# Patient Record
Sex: Male | Born: 1985 | Race: White | Hispanic: No | Marital: Single | Smoking: Never smoker
Health system: Southern US, Community
[De-identification: ages and names within clinical notes are randomized; demographics above are authoritative.]

## PROBLEM LIST (undated history)

## (undated) DIAGNOSIS — E079 Disorder of thyroid, unspecified: Secondary | ICD-10-CM

---

## 2008-07-11 ENCOUNTER — Emergency Department: Payer: Self-pay | Admitting: Emergency Medicine

## 2009-06-28 ENCOUNTER — Emergency Department: Payer: Self-pay | Admitting: Internal Medicine

## 2009-07-05 ENCOUNTER — Emergency Department: Payer: Self-pay | Admitting: Emergency Medicine

## 2016-09-08 ENCOUNTER — Emergency Department (HOSPITAL_COMMUNITY)
Admission: EM | Admit: 2016-09-08 | Discharge: 2016-09-08 | Disposition: A | Discharge status: Home / Self Care | Payer: Self-pay | Attending: Emergency Medicine | Admitting: Emergency Medicine

## 2016-09-08 ENCOUNTER — Other Ambulatory Visit: Payer: Self-pay

## 2016-09-08 ENCOUNTER — Encounter (HOSPITAL_COMMUNITY): Payer: Self-pay | Admitting: *Deleted

## 2016-09-08 ENCOUNTER — Emergency Department (HOSPITAL_COMMUNITY): Payer: Self-pay

## 2016-09-08 DIAGNOSIS — R6884 Jaw pain: Secondary | ICD-10-CM | POA: Insufficient documentation

## 2016-09-08 DIAGNOSIS — M25512 Pain in left shoulder: Secondary | ICD-10-CM | POA: Insufficient documentation

## 2016-09-08 HISTORY — DX: Disorder of thyroid, unspecified: E07.9

## 2016-09-08 LAB — BASIC METABOLIC PANEL
Anion gap: 5 (ref 5–15)
BUN: 17 mg/dL (ref 6–20)
CHLORIDE: 105 mmol/L (ref 101–111)
CO2: 30 mmol/L (ref 22–32)
Calcium: 9.6 mg/dL (ref 8.9–10.3)
Creatinine, Ser: 1.22 mg/dL (ref 0.61–1.24)
GFR calc non Af Amer: >60 mL/min (ref >60)
Glucose, Bld: 103 mg/dL — ABNORMAL HIGH (ref 65–99)
POTASSIUM: 3.6 mmol/L (ref 3.5–5.1)
SODIUM: 140 mmol/L (ref 135–145)

## 2016-09-08 LAB — CBC
HEMATOCRIT: 44.6 % (ref 39.0–52.0)
HEMOGLOBIN: 15.7 g/dL (ref 13.0–17.0)
MCH: 30.5 pg (ref 26.0–34.0)
MCHC: 35.2 g/dL (ref 30.0–36.0)
MCV: 86.6 fL (ref 78.0–100.0)
Platelets: 280 10*3/uL (ref 150–400)
RBC: 5.15 MIL/uL (ref 4.22–5.81)
RDW: 12.3 % (ref 11.5–15.5)
WBC: 9.2 10*3/uL (ref 4.0–10.5)

## 2016-09-08 LAB — I-STAT TROPONIN, ED: Troponin i, poc: 0 ng/mL (ref 0.00–0.08)

## 2016-09-08 MED ORDER — KETOROLAC TROMETHAMINE 30 MG/ML IJ SOLN
30.0000 mg | Freq: Once | INTRAMUSCULAR | Status: AC
  Administered 2016-09-08: 30 mg via INTRAVENOUS
  Filled 2016-09-08: qty 1

## 2016-09-08 MED ORDER — NAPROXEN 500 MG PO TABS: 500.0000 mg | ORAL_TABLET | Freq: Two times a day (BID) | ORAL | 0 refills | Status: AC

## 2016-09-08 NOTE — ED Provider Notes (Signed)
[facility]-EMERGENCY DEPT Provider Note   CSN: [id number] Arrival date & time: 09/08/16  [id number]     History   Chief Complaint Chief Complaint  Patient presents with  . Shoulder Pain  . Jaw Pain    HPI Todd Parker is a 30 y.o. male.  Aching and burning in left shoulder x 1 day, constant. No injury or recent stress to the joint. No aggravating or alleviating factors. 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  No medication attempted prior to arrival to help alleviate the pain.   He also reports pain and stiffness to bilateral masseters that started around 5 hours ago and has gotten progressively worse. No recent penetrating wound in the last week. No facial swelling, dental pain, difficulty swallowing.   The history is provided by the patient. No language interpreter was used.  Shoulder Pain   This is a recurrent problem. The current episode started more than 2 days ago. The pain is present in the left shoulder. The pain is mild. Pertinent negatives include no numbness.    Past Medical History:  Diagnosis Date  . Thyroid disease     There are no active problems to display for this patient.   History reviewed. No pertinent surgical history.     Home Medications    Prior to Admission medications   Not on File    Family History No family history on file.  Social History Social History  Substance Use Topics  . Smoking status: Never Smoker  . Smokeless tobacco: Never Used  . Alcohol use Yes     Allergies   Review of patient's allergies indicates no known allergies.   Review of Systems Review of Systems  Constitutional: Negative for fever.  HENT: Negative for facial swelling, sore throat and trouble swallowing.        See HPI.  Respiratory: Negative for shortness of breath.   Cardiovascular: Negative for chest pain.  Gastrointestinal: Negative for abdominal pain and nausea.  Musculoskeletal:  Negative for neck pain and neck stiffness.       See HPI.  Skin: Negative for color change and wound.  Neurological: Negative for weakness and numbness.     Physical Exam Updated Vital Signs BP (!) 130/101   Pulse 66   Resp 18   Ht 6\' 1"  (1.854 m)   Wt 83.9 kg   SpO2 99%   BMI 24.41 kg/m   Physical Exam  Constitutional: He appears well-developed and well-nourished.  HENT:  Head: Normocephalic.  No facial swelling. Mild tenderness anterior to TMJ's bilaterally. Good dentition without widespread decay. Oropharynx is benign. TM's clear bilaterally.  Neck: Normal range of motion. Neck supple.  Cardiovascular: Normal rate and regular rhythm.   Pulmonary/Chest: Effort normal and breath sounds normal. He has no wheezes. He has no rales.  Abdominal: Soft. Bowel sounds are normal. There is no tenderness. There is no rebound and no guarding.  Musculoskeletal: Normal range of motion.  Left shoulder has point tenderness to anterior joint. No swelling. Painful but full ROM. Distal grips are equal in bilateral UE's. No midline cervical tenderness.   Neurological: He is alert. No cranial nerve deficit.  Skin: Skin is warm and dry. No rash noted.  Psychiatric: He has a normal mood and affect.     ED Treatments / Results  Labs (all labs ordered are listed, but only abnormal results are displayed) Labs Reviewed  BASIC METABOLIC PANEL -  Abnormal; Notable for the following:       Result Value   Glucose, Bld 103 (*)    All other components within normal limits  CBC  I-STAT TROPOININ, ED    EKG  EKG Interpretation None       Radiology No results found.  Procedures Procedures (including critical care time)  Medications Ordered in ED Medications - No data to display   Initial Impression / Assessment and Plan / ED Course  I have reviewed the triage vital signs and the nursing notes.  Pertinent labs & imaging results that were available during my care of the patient were  reviewed by me and considered in my medical decision making (see chart for details).  Clinical Course    Patient with recurrent left shoulder pain without evidence infection or injury. No objective findings of facial swelling. Doubt tetany. Possible TMJ.   Final Clinical Impressions(s) / ED Diagnoses   Final diagnoses:  None  1. Left shoulder pain 2. Jaw pain  New Prescriptions New Prescriptions   No medications on file     Danne HarborShari Taraann Olthoff, PA-C 09/13/16 2125    April Palumbo, MD 09/13/16 [phone]

## 2016-09-08 NOTE — Discharge Instructions (Signed)
YOU CAN BE DISCHARGED HOME WITH THE RECOMMENDATION TO TAKE ANTI-INFLAMMATORY MEDICATIONS AS PRESCRIBED FOR SHOULDER AND JAW PAIN. RETURN TO THE EMERGENCY DEPARTMENT IF PAIN INCREASES TO BECOME SEVERE, YOUR JAW PAIN PROGRESSES TO WHERE YOU CAN'T OPEN, YOU DEVELOP A FEVER OR NEW SYMPTOMS OF CONCERN.

## 2016-09-08 NOTE — ED Triage Notes (Signed)
Pt c/o pain to left shoulder and jaw stiffness since yesterday.

## 2016-09-13 NOTE — ED Provider Notes (Signed)
Medical screening examination/treatment/procedure(s) were performed by non-physician practitioner and as supervising physician I was immediately available for consultation/collaboration.   EKG Interpretation  Date/Time:  Sunday September 08 2016 03:41:18 EDT Ventricular Rate:  60 PR Interval:    QRS Duration: 96 QT Interval:  379 QTC Calculation: 379 R Axis:   69 Text Interpretation:  Sinus rhythm No significant change since last tracing Confirmed by Karma GanjaLINKER  MD, MARTHA [phone]([id number]) on 09/09/2016 7:20:45 PM        Yang Rack, MD 09/13/16 [phone]

## 2017-10-02 IMAGING — CR DG CHEST 2V
2 series · 2 of 2 positions shown · non-contrast
Comparison: None.

CLINICAL DATA: 30-year-old male with left neck and jaw pain.

EXAM:
CHEST  2 VIEW

[w chest pa]
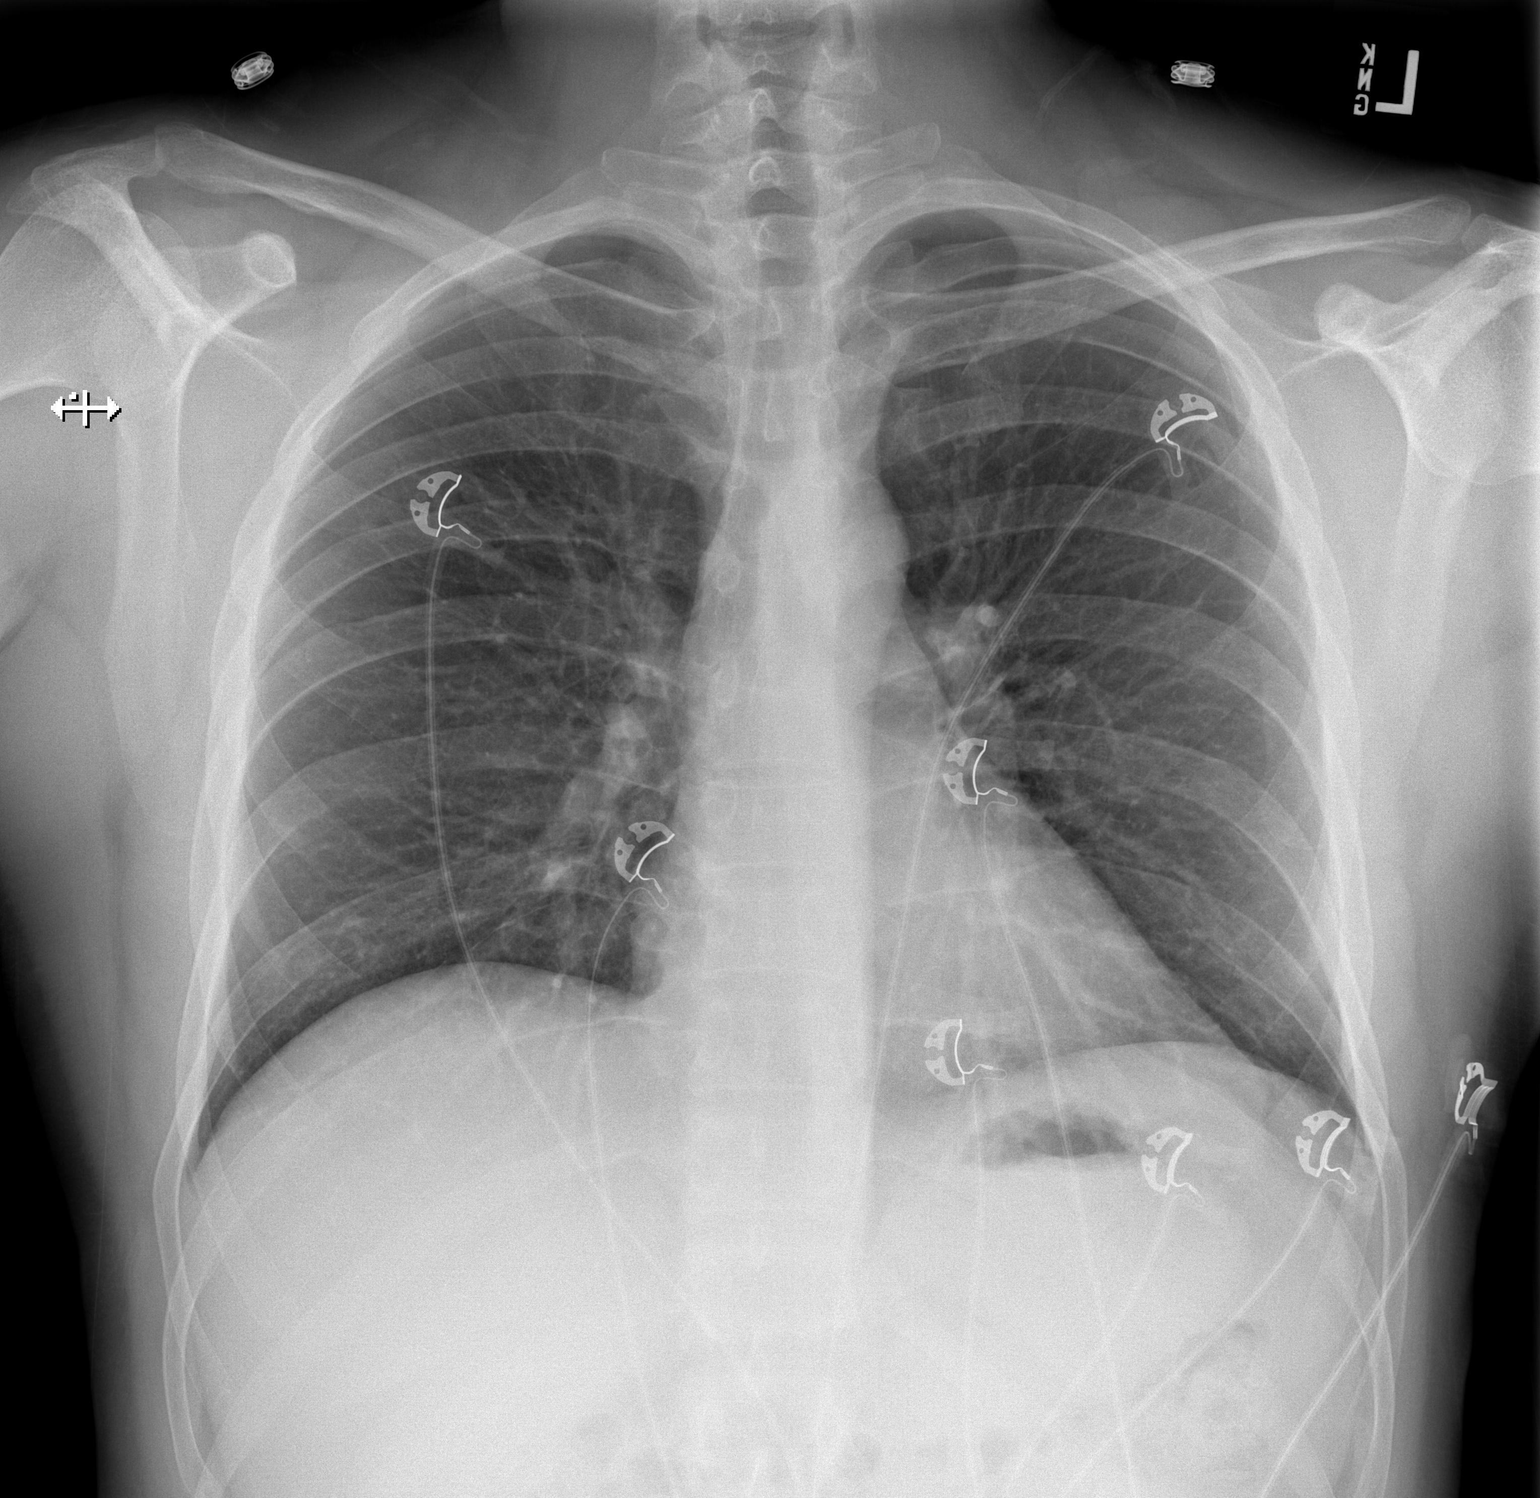

[w chest lat]
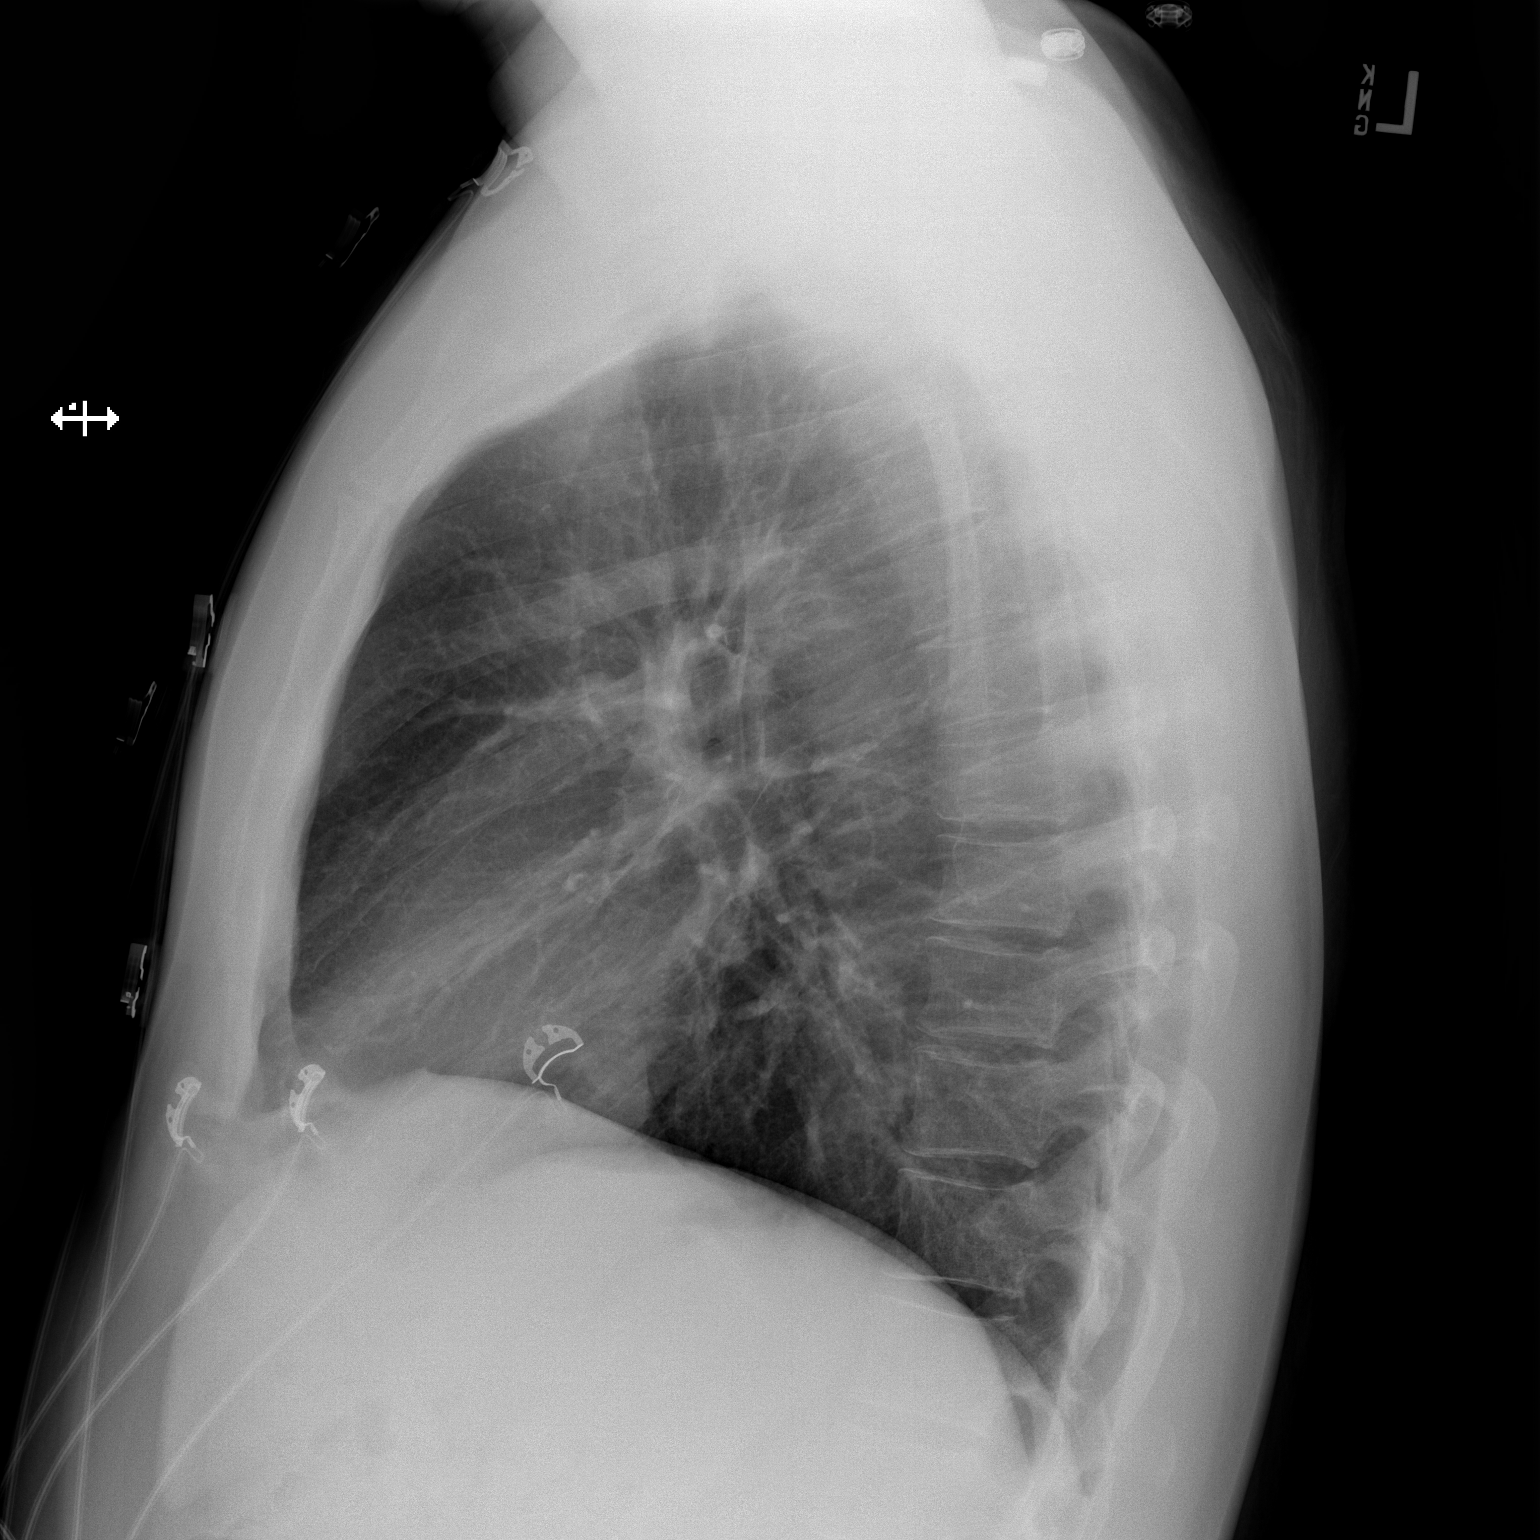

[2 of 2 positions shown; findings below may reference images not displayed]

FINDINGS: The heart size and mediastinal contours are within normal limits.
Both lungs are clear. The visualized skeletal structures are
unremarkable.
IMPRESSION: No active cardiopulmonary disease.
